# Patient Record
Sex: Female | Born: 1975 | Race: White | Hispanic: No | Marital: Married | Smoking: Never smoker
Health system: Southern US, Community
[De-identification: ages and names within clinical notes are randomized; demographics above are authoritative.]

## PROBLEM LIST (undated history)

## (undated) DIAGNOSIS — J45990 Exercise induced bronchospasm: Secondary | ICD-10-CM

## (undated) DIAGNOSIS — Z87442 Personal history of urinary calculi: Secondary | ICD-10-CM

## (undated) DIAGNOSIS — K219 Gastro-esophageal reflux disease without esophagitis: Secondary | ICD-10-CM

## (undated) DIAGNOSIS — D509 Iron deficiency anemia, unspecified: Secondary | ICD-10-CM

## (undated) DIAGNOSIS — K603 Anal fistula: Secondary | ICD-10-CM

---
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
## 2015-06-30 ENCOUNTER — Other Ambulatory Visit: Payer: Self-pay | Admitting: Physician Assistant

## 2015-06-30 DIAGNOSIS — R198 Other specified symptoms and signs involving the digestive system and abdomen: Secondary | ICD-10-CM

## 2015-06-30 DIAGNOSIS — L988 Other specified disorders of the skin and subcutaneous tissue: Secondary | ICD-10-CM

## 2015-07-06 ENCOUNTER — Ambulatory Visit
Admission: RE | Admit: 2015-07-06 | Discharge: 2015-07-06 | Disposition: A | Discharge status: Home / Self Care | Payer: BLUE CROSS/BLUE SHIELD | Source: Ambulatory Visit | Attending: Physician Assistant | Admitting: Physician Assistant

## 2015-07-06 DIAGNOSIS — R198 Other specified symptoms and signs involving the digestive system and abdomen: Secondary | ICD-10-CM

## 2015-07-06 DIAGNOSIS — L988 Other specified disorders of the skin and subcutaneous tissue: Secondary | ICD-10-CM

## 2015-07-06 MED ORDER — IOPAMIDOL (ISOVUE-300) INJECTION 61%
100.0000 mL | Freq: Once | INTRAVENOUS | Status: AC | PRN
  Administered 2015-07-06: 100 mL via INTRAVENOUS

## 2015-08-09 ENCOUNTER — Other Ambulatory Visit: Payer: Self-pay | Admitting: General Surgery

## 2015-08-09 NOTE — H&P (Signed)
Debbie EdwardsCynthia E. Duffy 08/09/2015 9:02 AM Location: [facility] Patient #: [id number] DOB: 23-Jan-1976 Married / Language: [place] / Race: White Female  History of Present Illness Debbie Duffy([name] MD; 08/09/2015 9:20 AM) Patient words: fissure.  The patient is a 39 year old female who presents with a complaint of anal problems. 39 year old female who has a history of an anal fissure approximately one year prior. She presents today with approximately four-month history of bleeding with bowel movements as well as purulent rectal drainage. She denies any pain. She is having regular bowel movements.   Other Problems [name](Kelly Dockery, LPN; 04/54/098111/28/2016 9:02 AM) Asthma Back Pain Gastroesophageal Reflux Disease Hemorrhoids Kidney Stone  Past Surgical History [name](Kelly Dockery, LPN; 19/14/782911/28/2016 9:02 AM) Oral Surgery  Diagnostic Studies History [name](Kelly Dockery, LPN; 56/21/308611/28/2016 9:02 AM) Colonoscopy never Mammogram within last year Pap Smear 1-5 years ago  Allergies [name](Kelly Dockery, LPN; 57/84/696211/28/2016 9:03 AM) No Known Drug Allergies 08/09/2015  Medication History [name](Kelly Dockery, LPN; 95/28/413211/28/2016 9:03 AM) Colleen CanJunel 1/20 (1-20MG -MCG Tablet, Oral) Active. Multivitamins (Oral) Active. Medications Reconciled  Social History [name](Kelly Dockery, LPN; 44/01/027211/28/2016 9:02 AM) Alcohol use Occasional alcohol use. Caffeine use Carbonated beverages. No drug use Tobacco use Never smoker.  Family History [name](Kelly Dockery, LPN; 53/66/440311/28/2016 9:02 AM) Breast Cancer Family Members In General. Heart disease in female family member before age 40  Pregnancy / Birth History [name](Kelly Dockery, LPN; 47/42/595611/28/2016 9:02 AM) Age at menarche 11 years. Contraceptive History Oral contraceptives. Gravida 3 Maternal age 40-25 Para 3 Regular periods     Review of Systems [name](Kelly Dockery LPN; 38/75/643311/28/2016 9:02 AM) General Present- Fatigue. Not Present- Appetite Loss, Chills, Fever, Night Sweats, Weight Gain and  Weight Loss. Skin Not Present- Change in Wart/Mole, Dryness, Hives, Jaundice, New Lesions, Non-Healing Wounds, Rash and Ulcer. HEENT Not Present- Earache, Hearing Loss, Hoarseness, Nose Bleed, Oral Ulcers, Ringing in the Ears, Seasonal Allergies, Sinus Pain, Sore Throat, Visual Disturbances, Wears glasses/contact lenses and Yellow Eyes. Respiratory Not Present- Bloody sputum, Chronic Cough, Difficulty Breathing, Snoring and Wheezing. Breast Not Present- Breast Mass, Breast Pain, Nipple Discharge and Skin Changes. Cardiovascular Not Present- Chest Pain, Difficulty Breathing Lying Down, Leg Cramps, Palpitations, Rapid Heart Rate, Shortness of Breath and Swelling of Extremities. Gastrointestinal Present- Abdominal Pain, Bloating, Bloody Stool, Change in Bowel Habits, Excessive gas, Hemorrhoids and Indigestion. Not Present- Chronic diarrhea, Constipation, Difficulty Swallowing, Gets full quickly at meals, Nausea, Rectal Pain and Vomiting. Female Genitourinary Not Present- Frequency, Nocturia, Painful Urination, Pelvic Pain and Urgency. Musculoskeletal Not Present- Back Pain, Joint Pain, Joint Stiffness, Muscle Pain, Muscle Weakness and Swelling of Extremities. Neurological Not Present- Decreased Memory, Fainting, Headaches, Numbness, Seizures, Tingling, Tremor, Trouble walking and Weakness. Psychiatric Not Present- Anxiety, Bipolar, Change in Sleep Pattern, Depression, Fearful and Frequent crying. Endocrine Present- Hair Changes. Not Present- Cold Intolerance, Excessive Hunger, Heat Intolerance, Hot flashes and New Diabetes. Hematology Not Present- Easy Bruising, Excessive bleeding, Gland problems, HIV and Persistent Infections.  Vitals Tresa Endo(Kelly Dockery LPN; 29/51/884111/28/2016 9:03 AM) 08/09/2015 9:02 AM Weight: 162.2 lb Height: 63in Body Surface Area: 1.77 m Body Mass Index: 28.73 kg/m  Temp.: 98.67F(Oral)  Pulse: 65 (Regular)  BP: 120/74 (Sitting, Left Arm, Standard)      Physical Exam  Debbie Duffy(Tishia Maestre MD; 08/09/2015 9:26 AM)  General Mental Status-Alert. General Appearance-Not in acute distress. Build & Nutrition-Well nourished. Posture-Normal posture. Gait-Normal.  Head and Neck Head-normocephalic, atraumatic with no lesions or palpable masses. Trachea-midline.  Chest and Lung Exam Chest and lung exam reveals -on auscultation, normal breath sounds, no  adventitious sounds and normal vocal resonance.  Cardiovascular Cardiovascular examination reveals -normal heart sounds, regular rate and rhythm with no murmurs.  Abdomen Inspection Inspection of the abdomen reveals - No Hernias. Palpation/Percussion Palpation and Percussion of the abdomen reveal - Soft, Non Tender, No Rigidity (guarding), No hepatosplenomegaly and No Palpable abdominal masses.  Rectal Anorectal Exam External - anorectal fistula(posterior midline).  Neurologic Neurologic evaluation reveals -alert and oriented x 3 with no impairment of recent or remote memory, normal attention span and ability to concentrate, normal sensation and normal coordination.  Musculoskeletal Normal Exam - Bilateral-Upper Extremity Strength Normal and Lower Extremity Strength Normal.    Assessment & Plan Debbie Levee MD; 08/09/2015 9:28 AM)  ANAL FISTULA (K60.3) Impression: 39 year old female who presents to the office with complaints of purulent drainage and bleeding with bowel movements. On exam today she has a posterior midline wound with purulent drainage. This appears to be a fistula. I have recommended annual exam under anesthesia and after confirmation, fistulotomy. We discussed risk and benefits this. Risk include continued pain and bleeding. There is approximately 2% risk of recurrence. I think this is a high likelihood of relieving her symptoms. We discussed that the wound will probably be to slow to heal and may take up to 4-6 weeks.

## 2015-09-08 ENCOUNTER — Encounter (HOSPITAL_BASED_OUTPATIENT_CLINIC_OR_DEPARTMENT_OTHER): Payer: Self-pay | Admitting: *Deleted

## 2015-09-08 NOTE — Progress Notes (Signed)
NPO AFTER MN.  ARRIVE AT 0730.  NEEDS HG AND URINE PREG.  WILL TAKE AM MEDS W/ SIPS OF WATER.

## 2015-09-15 ENCOUNTER — Ambulatory Visit (HOSPITAL_BASED_OUTPATIENT_CLINIC_OR_DEPARTMENT_OTHER): Payer: Managed Care, Other (non HMO) | Admitting: Anesthesiology

## 2015-09-15 ENCOUNTER — Encounter (HOSPITAL_BASED_OUTPATIENT_CLINIC_OR_DEPARTMENT_OTHER): Payer: Self-pay | Admitting: *Deleted

## 2015-09-15 ENCOUNTER — Encounter (HOSPITAL_BASED_OUTPATIENT_CLINIC_OR_DEPARTMENT_OTHER)
Admission: RE | Disposition: A | Discharge status: Home / Self Care | Payer: Self-pay | Source: Ambulatory Visit | Attending: General Surgery

## 2015-09-15 ENCOUNTER — Ambulatory Visit (HOSPITAL_BASED_OUTPATIENT_CLINIC_OR_DEPARTMENT_OTHER)
Admission: RE | Admit: 2015-09-15 | Discharge: 2015-09-15 | Disposition: A | Discharge status: Home / Self Care | Payer: Managed Care, Other (non HMO) | Source: Ambulatory Visit | Attending: General Surgery | Admitting: General Surgery

## 2015-09-15 DIAGNOSIS — K219 Gastro-esophageal reflux disease without esophagitis: Secondary | ICD-10-CM | POA: Diagnosis not present

## 2015-09-15 DIAGNOSIS — J45909 Unspecified asthma, uncomplicated: Secondary | ICD-10-CM | POA: Insufficient documentation

## 2015-09-15 DIAGNOSIS — Z87442 Personal history of urinary calculi: Secondary | ICD-10-CM | POA: Diagnosis not present

## 2015-09-15 DIAGNOSIS — K644 Residual hemorrhoidal skin tags: Secondary | ICD-10-CM | POA: Diagnosis not present

## 2015-09-15 DIAGNOSIS — K603 Anal fistula: Secondary | ICD-10-CM | POA: Diagnosis not present

## 2015-09-15 HISTORY — DX: Anal fistula: K60.3

## 2015-09-15 HISTORY — DX: Iron deficiency anemia, unspecified: D50.9

## 2015-09-15 HISTORY — DX: Personal history of urinary calculi: Z87.442

## 2015-09-15 HISTORY — DX: Gastro-esophageal reflux disease without esophagitis: K21.9

## 2015-09-15 HISTORY — DX: Exercise induced bronchospasm: J45.990

## 2015-09-15 HISTORY — PX: EVALUATION UNDER ANESTHESIA WITH FISTULECTOMY: SHX5623

## 2015-09-15 LAB — POCT PREGNANCY, URINE: PREG TEST UR: NEGATIVE

## 2015-09-15 LAB — POCT HEMOGLOBIN-HEMACUE: HEMOGLOBIN: 14.8 g/dL (ref 12.0–15.0)

## 2015-09-15 SURGERY — EXAM UNDER ANESTHESIA WITH FISTULECTOMY
Anesthesia: Monitor Anesthesia Care

## 2015-09-15 MED ORDER — HYDROMORPHONE HCL 1 MG/ML IJ SOLN
0.2500 mg | INTRAMUSCULAR | Status: DC | PRN
  Filled 2015-09-15: qty 1

## 2015-09-15 MED ORDER — LIDOCAINE 5 % EX OINT
TOPICAL_OINTMENT | CUTANEOUS | Status: DC | PRN
  Administered 2015-09-15: 1

## 2015-09-15 MED ORDER — ACETAMINOPHEN 325 MG PO TABS
650.0000 mg | ORAL_TABLET | ORAL | Status: DC | PRN
  Filled 2015-09-15: qty 2

## 2015-09-15 MED ORDER — BUPIVACAINE-EPINEPHRINE 0.5% -1:200000 IJ SOLN
INTRAMUSCULAR | Status: DC | PRN
  Administered 2015-09-15: 17 mL

## 2015-09-15 MED ORDER — FENTANYL CITRATE (PF) 100 MCG/2ML IJ SOLN
INTRAMUSCULAR | Status: DC | PRN
  Administered 2015-09-15: 25 ug via INTRAVENOUS

## 2015-09-15 MED ORDER — ONDANSETRON HCL 4 MG/2ML IJ SOLN
INTRAMUSCULAR | Status: AC
  Filled 2015-09-15: qty 2

## 2015-09-15 MED ORDER — BUPIVACAINE LIPOSOME 1.3 % IJ SUSP
INTRAMUSCULAR | Status: DC | PRN
  Administered 2015-09-15: 20 mL

## 2015-09-15 MED ORDER — PROPOFOL 500 MG/50ML IV EMUL
INTRAVENOUS | Status: AC
  Filled 2015-09-15: qty 50

## 2015-09-15 MED ORDER — SODIUM CHLORIDE 0.9 % IJ SOLN
INTRAMUSCULAR | Status: AC
  Filled 2015-09-15: qty 50

## 2015-09-15 MED ORDER — OXYCODONE HCL 5 MG PO TABS
5.0000 mg | ORAL_TABLET | Freq: Once | ORAL | Status: DC | PRN
  Filled 2015-09-15: qty 1

## 2015-09-15 MED ORDER — SODIUM CHLORIDE 0.9 % IV SOLN
250.0000 mL | INTRAVENOUS | Status: DC | PRN
  Filled 2015-09-15: qty 250

## 2015-09-15 MED ORDER — SODIUM CHLORIDE 0.9 % IJ SOLN
3.0000 mL | Freq: Two times a day (BID) | INTRAMUSCULAR | Status: DC
  Filled 2015-09-15: qty 3

## 2015-09-15 MED ORDER — SODIUM CHLORIDE 0.9 % IJ SOLN
3.0000 mL | INTRAMUSCULAR | Status: DC | PRN
  Filled 2015-09-15: qty 3

## 2015-09-15 MED ORDER — LIDOCAINE HCL (CARDIAC) 20 MG/ML IV SOLN
INTRAVENOUS | Status: AC
  Filled 2015-09-15: qty 5

## 2015-09-15 MED ORDER — BUPIVACAINE-EPINEPHRINE (PF) 0.5% -1:200000 IJ SOLN
INTRAMUSCULAR | Status: AC
  Filled 2015-09-15: qty 30

## 2015-09-15 MED ORDER — ARTIFICIAL TEARS OP OINT
TOPICAL_OINTMENT | OPHTHALMIC | Status: AC
  Filled 2015-09-15: qty 3.5

## 2015-09-15 MED ORDER — KETAMINE HCL 10 MG/ML IJ SOLN
INTRAMUSCULAR | Status: DC | PRN
  Administered 2015-09-15 (×2): 15 mg via INTRAVENOUS

## 2015-09-15 MED ORDER — KETAMINE HCL 10 MG/ML IJ SOLN
INTRAMUSCULAR | Status: AC
  Filled 2015-09-15: qty 1

## 2015-09-15 MED ORDER — FENTANYL CITRATE (PF) 100 MCG/2ML IJ SOLN
INTRAMUSCULAR | Status: AC
  Filled 2015-09-15: qty 2

## 2015-09-15 MED ORDER — ONDANSETRON HCL 4 MG/2ML IJ SOLN
INTRAMUSCULAR | Status: DC | PRN
  Administered 2015-09-15: 4 mg via INTRAVENOUS

## 2015-09-15 MED ORDER — OXYCODONE HCL 5 MG PO TABS: 5.0000 mg | ORAL_TABLET | ORAL | Status: AC | PRN

## 2015-09-15 MED ORDER — DEXAMETHASONE SODIUM PHOSPHATE 10 MG/ML IJ SOLN
INTRAMUSCULAR | Status: AC
  Filled 2015-09-15: qty 1

## 2015-09-15 MED ORDER — PROPOFOL 500 MG/50ML IV EMUL
INTRAVENOUS | Status: DC | PRN
  Administered 2015-09-15: 50 ug/kg/min via INTRAVENOUS

## 2015-09-15 MED ORDER — OXYCODONE HCL 5 MG PO TABS
5.0000 mg | ORAL_TABLET | ORAL | Status: DC | PRN
  Filled 2015-09-15: qty 2

## 2015-09-15 MED ORDER — MIDAZOLAM HCL 5 MG/5ML IJ SOLN
INTRAMUSCULAR | Status: DC | PRN
  Administered 2015-09-15 (×2): 2 mg via INTRAVENOUS

## 2015-09-15 MED ORDER — BUPIVACAINE LIPOSOME 1.3 % IJ SUSP
INTRAMUSCULAR | Status: AC
  Filled 2015-09-15: qty 20

## 2015-09-15 MED ORDER — LIDOCAINE HCL (CARDIAC) 20 MG/ML IV SOLN
INTRAVENOUS | Status: DC | PRN
  Administered 2015-09-15: 50 mg via INTRAVENOUS

## 2015-09-15 MED ORDER — DEXAMETHASONE SODIUM PHOSPHATE 4 MG/ML IJ SOLN
INTRAMUSCULAR | Status: DC | PRN
  Administered 2015-09-15: 10 mg via INTRAVENOUS

## 2015-09-15 MED ORDER — ACETAMINOPHEN 650 MG RE SUPP
650.0000 mg | RECTAL | Status: DC | PRN
  Filled 2015-09-15: qty 1

## 2015-09-15 MED ORDER — ONDANSETRON HCL 4 MG/2ML IJ SOLN
4.0000 mg | Freq: Once | INTRAMUSCULAR | Status: AC
  Administered 2015-09-15: 4 mg via INTRAVENOUS
  Filled 2015-09-15: qty 2

## 2015-09-15 MED ORDER — LIDOCAINE 5 % EX OINT
TOPICAL_OINTMENT | CUTANEOUS | Status: AC
  Filled 2015-09-15: qty 35.44

## 2015-09-15 MED ORDER — OXYCODONE HCL 5 MG/5ML PO SOLN
5.0000 mg | Freq: Once | ORAL | Status: DC | PRN
  Filled 2015-09-15: qty 5

## 2015-09-15 MED ORDER — MIDAZOLAM HCL 2 MG/2ML IJ SOLN
INTRAMUSCULAR | Status: AC
  Filled 2015-09-15: qty 4

## 2015-09-15 MED ORDER — MEPERIDINE HCL 25 MG/ML IJ SOLN
6.2500 mg | INTRAMUSCULAR | Status: DC | PRN
  Filled 2015-09-15: qty 1

## 2015-09-15 MED ORDER — LACTATED RINGERS IV SOLN
INTRAVENOUS | Status: DC
  Administered 2015-09-15 (×2): via INTRAVENOUS
  Filled 2015-09-15: qty 1000

## 2015-09-15 MED ORDER — 0.9 % SODIUM CHLORIDE (POUR BTL) OPTIME
TOPICAL | Status: DC | PRN
  Administered 2015-09-15: 500 mL

## 2015-09-15 SURGICAL SUPPLY — 45 items
BENZOIN TINCTURE PRP APPL 2/3 (GAUZE/BANDAGES/DRESSINGS) ×2 IMPLANT
BLADE HEX COATED 2.75 (ELECTRODE) IMPLANT
BLADE SURG 10 STRL SS (BLADE) IMPLANT
BRIEF STRETCH FOR OB PAD LRG (UNDERPADS AND DIAPERS) ×4 IMPLANT
COVER BACK TABLE 60X90IN (DRAPES) ×2 IMPLANT
COVER MAYO STAND STRL (DRAPES) ×2 IMPLANT
DRAPE PED LAPAROTOMY (DRAPES) ×2 IMPLANT
DRAPE UTILITY XL STRL (DRAPES) ×2 IMPLANT
DRSG PAD ABDOMINAL 8X10 ST (GAUZE/BANDAGES/DRESSINGS) ×2 IMPLANT
ELECT BLADE 6.5 .24CM SHAFT (ELECTRODE) IMPLANT
ELECT REM PT RETURN 9FT ADLT (ELECTROSURGICAL) ×2
ELECTRODE REM PT RTRN 9FT ADLT (ELECTROSURGICAL) ×1 IMPLANT
GAUZE SPONGE 4X4 12PLY STRL (GAUZE/BANDAGES/DRESSINGS) ×2 IMPLANT
GAUZE SPONGE 4X4 16PLY XRAY LF (GAUZE/BANDAGES/DRESSINGS) IMPLANT
GLOVE BIO SURGEON STRL SZ 6.5 (GLOVE) IMPLANT
GLOVE BIOGEL PI IND STRL 6.5 (GLOVE) ×1 IMPLANT
GLOVE BIOGEL PI INDICATOR 6.5 (GLOVE) ×1
GLOVE INDICATOR 7.0 STRL GRN (GLOVE) ×2 IMPLANT
GOWN STRL REUS W/ TWL LRG LVL3 (GOWN DISPOSABLE) ×1 IMPLANT
GOWN STRL REUS W/TWL 2XL LVL3 (GOWN DISPOSABLE) ×4 IMPLANT
GOWN STRL REUS W/TWL LRG LVL3 (GOWN DISPOSABLE) ×1
HYDROGEN PEROXIDE 16OZ (MISCELLANEOUS) IMPLANT
KIT ROOM TURNOVER WOR (KITS) ×2 IMPLANT
NEEDLE HYPO 22GX1.5 SAFETY (NEEDLE) ×2 IMPLANT
NEEDLE HYPO 25X1 1.5 SAFETY (NEEDLE) IMPLANT
NS IRRIG 500ML POUR BTL (IV SOLUTION) ×2 IMPLANT
PACK BASIN DAY SURGERY FS (CUSTOM PROCEDURE TRAY) ×2 IMPLANT
PAD ABD 8X10 STRL (GAUZE/BANDAGES/DRESSINGS) ×2 IMPLANT
PAD ARMBOARD 7.5X6 YLW CONV (MISCELLANEOUS) ×2 IMPLANT
PENCIL BUTTON HOLSTER BLD 10FT (ELECTRODE) ×2 IMPLANT
SPONGE GAUZE 4X4 12PLY (GAUZE/BANDAGES/DRESSINGS) ×2 IMPLANT
SPONGE SURGIFOAM ABS GEL 100 (HEMOSTASIS) IMPLANT
SPONGE SURGIFOAM ABS GEL 12-7 (HEMOSTASIS) IMPLANT
STAPLER PROXIMATE HCS (STAPLE) IMPLANT
STAPLER VISISTAT 35W (STAPLE) ×2 IMPLANT
SUT CHROMIC 2 0 SH (SUTURE) IMPLANT
SUT GUT CHROMIC 3 0 (SUTURE) ×2 IMPLANT
SUT PROLENE 2 0 BLUE (SUTURE) IMPLANT
SUT VIC AB 4-0 P-3 18XBRD (SUTURE) IMPLANT
SUT VIC AB 4-0 P3 18 (SUTURE)
SUT VIC AB 4-0 SH 18 (SUTURE) IMPLANT
SYR CONTROL 10ML LL (SYRINGE) ×2 IMPLANT
TRAY DSU PREP LF (CUSTOM PROCEDURE TRAY) ×2 IMPLANT
TUBE CONNECTING 12X1/4 (SUCTIONS) ×2 IMPLANT
YANKAUER SUCT BULB TIP NO VENT (SUCTIONS) ×2 IMPLANT

## 2015-09-15 NOTE — H&P (Signed)
The patient is a 40 year old female who presents with a complaint of anal problems. 40 year old female who has a history of an anal fissure approximately one year prior. She presents today with approximately four-month history of bleeding with bowel movements as well as purulent rectal drainage. She denies any pain. She is having regular bowel movements.   Other Problems [name], LPN; 81/19/1478 9:02 AM) Asthma Back Pain Gastroesophageal Reflux Disease Hemorrhoids Kidney Stone  Past Surgical History [name], LPN; 29/56/2130 9:02 AM) Oral Surgery  Diagnostic Studies History [name], LPN; 86/57/8469 9:02 AM) Colonoscopy never Mammogram within last year Pap Smear 1-5 years ago  Allergies [name], LPN; 62/95/2841 9:03 AM) No Known Drug Allergies 08/09/2015  Medication History [name], LPN; 32/44/0102 9:03 AM) Colleen Can 1/20 (1-20MG -MCG Tablet, Oral) Active. Multivitamins (Oral) Active. Medications Reconciled  Social History [name], LPN; 72/53/6644 9:02 AM) Alcohol use Occasional alcohol use. Caffeine use Carbonated beverages. No drug use Tobacco use Never smoker.  Family History [name], LPN; 03/47/4259 9:02 AM) Breast Cancer Family Members In General. Heart disease in female family member before age 5  Pregnancy / Birth History [name], LPN; 56/38/7564 9:02 AM) Age at menarche 11 years. Contraceptive History Oral contraceptives. Gravida 3 Maternal age 34-25 Para 3 Regular periods     Review of Systems  General Present- Fatigue. Not Present- Appetite Loss, Chills, Fever, Night Sweats, Weight Gain and Weight Loss. Skin Not Present- Change in Wart/Mole, Dryness, Hives, Jaundice, New Lesions, Non-Healing Wounds, Rash and Ulcer. HEENT Not Present- Earache, Hearing Loss, Hoarseness, Nose Bleed, Oral Ulcers, Ringing in the Ears, Seasonal Allergies, Sinus Pain, Sore Throat, Visual Disturbances,  Wears glasses/contact lenses and Yellow Eyes. Respiratory Not Present- Bloody sputum, Chronic Cough, Difficulty Breathing, Snoring and Wheezing. Breast Not Present- Breast Mass, Breast Pain, Nipple Discharge and Skin Changes. Cardiovascular Not Present- Chest Pain, Difficulty Breathing Lying Down, Leg Cramps, Palpitations, Rapid Heart Rate, Shortness of Breath and Swelling of Extremities. Gastrointestinal Present- Abdominal Pain, Bloating, Bloody Stool, Change in Bowel Habits, Excessive gas, Hemorrhoids and Indigestion. Not Present- Chronic diarrhea, Constipation, Difficulty Swallowing, Gets full quickly at meals, Nausea, Rectal Pain and Vomiting. Female Genitourinary Not Present- Frequency, Nocturia, Painful Urination, Pelvic Pain and Urgency. Musculoskeletal Not Present- Back Pain, Joint Pain, Joint Stiffness, Muscle Pain, Muscle Weakness and Swelling of Extremities. Neurological Not Present- Decreased Memory, Fainting, Headaches, Numbness, Seizures, Tingling, Tremor, Trouble walking and Weakness. Psychiatric Not Present- Anxiety, Bipolar, Change in Sleep Pattern, Depression, Fearful and Frequent crying. Endocrine Present- Hair Changes. Not Present- Cold Intolerance, Excessive Hunger, Heat Intolerance, Hot flashes and New Diabetes. Hematology Not Present- Easy Bruising, Excessive bleeding, Gland problems, HIV and Persistent Infections.  BP 123/73 mmHg  Pulse 67  Temp(Src) 98.6 F (37 C) (Oral)  Resp 16  Ht 5\' 3"  (1.6 m)  Wt 70.761 kg (156 lb)  BMI 27.64 kg/m2  SpO2 99%  LMP 09/11/2015   Physical Exam  General Mental Status-Alert. General Appearance-Not in acute distress. Build & Nutrition-Well nourished. Posture-Normal posture. Gait-Normal.  Head and Neck Head-normocephalic, atraumatic with no lesions or palpable masses. Trachea-midline.  Chest and Lung Exam Chest and lung exam reveals -on auscultation, normal breath sounds, no adventitious sounds and normal  vocal resonance.  Cardiovascular Cardiovascular examination reveals -normal heart sounds, regular rate and rhythm with no murmurs.  Abdomen Inspection Inspection of the abdomen reveals - No Hernias. Palpation/Percussion Palpation and Percussion of the abdomen reveal - Soft, Non Tender, No Rigidity (guarding), No hepatosplenomegaly and No Palpable abdominal  masses.  Rectal Anorectal Exam External - anorectal fistula(posterior midline).  Neurologic Neurologic evaluation reveals -alert and oriented x 3 with no impairment of recent or remote memory, normal attention span and ability to concentrate, normal sensation and normal coordination.  Musculoskeletal Normal Exam - Bilateral-Upper Extremity Strength Normal and Lower Extremity Strength Normal.    Assessment & Plan   ANAL FISTULA (K60.3) Impression: 40 year old female who presents to the office with complaints of purulent drainage and bleeding with bowel movements. On exam today she has a posterior midline wound with purulent drainage. This appears to be a fistula. I have recommended annual exam under anesthesia and after confirmation, fistulotomy. We discussed risk and benefits this. Risk include continued pain and bleeding. There is approximately 2% risk of recurrence. I think this is a high likelihood of relieving her symptoms. We discussed that the wound will probably be to slow to heal and may take up to 4-6 weeks.  She would also like to me to evaluate her skin.  We will excise this as well if it does not look like it will add much to her pain and recovery time.

## 2015-09-15 NOTE — Anesthesia Postprocedure Evaluation (Signed)
Anesthesia Post Note  Patient: Debbie BellingCynthia Duffy  Procedure(s) Performed: Procedure(s) (LRB): ANAL EXAM UNDER ANESTHESIA; FISTULOTMY; ANAL SKIN TAG EXCISION (N/A)  Patient location during evaluation: PACU Anesthesia Type: General Level of consciousness: awake and alert Pain management: pain level controlled Vital Signs Assessment: post-procedure vital signs reviewed and stable Respiratory status: spontaneous breathing, nonlabored ventilation and respiratory function stable Cardiovascular status: blood pressure returned to baseline and stable Postop Assessment: no signs of nausea or vomiting Anesthetic complications: no    Last Vitals:  Filed Vitals:   09/15/15 0927 09/15/15 0930  BP: 118/56 118/67  Pulse: 87 83  Temp: 36.6 C   Resp: 21 16    Last Pain: There were no vitals filed for this visit.               Naveah Brave A

## 2015-09-15 NOTE — Anesthesia Preprocedure Evaluation (Signed)
Anesthesia Evaluation  Patient identified by MRN, date of birth, ID band Patient awake    Reviewed: Allergy & Precautions, NPO status , Patient's Chart, lab work & pertinent test results  Airway Mallampati: I  TM Distance: >3 FB Neck ROM: Full    Dental  (+) Teeth Intact, Dental Advisory Given   Pulmonary asthma ,    breath sounds clear to auscultation       Cardiovascular  Rhythm:Regular Rate:Normal     Neuro/Psych    GI/Hepatic GERD  Medicated and Controlled,  Endo/Other    Renal/GU      Musculoskeletal   Abdominal   Peds  Hematology   Anesthesia Other Findings   Reproductive/Obstetrics                             Anesthesia Physical Anesthesia Plan  ASA: II  Anesthesia Plan: General   Post-op Pain Management:    Induction: Intravenous  Airway Management Planned: LMA  Additional Equipment:   Intra-op Plan:   Post-operative Plan: Extubation in OR  Informed Consent: I have reviewed the patients History and Physical, chart, labs and discussed the procedure including the risks, benefits and alternatives for the proposed anesthesia with the patient or authorized representative who has indicated his/her understanding and acceptance.   Dental advisory given  Plan Discussed with: CRNA, Anesthesiologist and Surgeon  Anesthesia Plan Comments:         Anesthesia Quick Evaluation

## 2015-09-15 NOTE — Discharge Instructions (Addendum)

## 2015-09-15 NOTE — Transfer of Care (Signed)
Last Vitals:  Filed Vitals:   09/15/15 0735  BP: 123/73  Pulse: 67  Temp: 37 C  Resp: 16    Immediate Anesthesia Transfer of Care Note  Patient: Debbie Duffy  Procedure(s) Performed: Procedure(s) (LRB): ANAL EXAM UNDER ANESTHESIA; FISTULOTMY; ANAL SKIN TAG EXCISION (N/A)  Patient Location: PACU  Anesthesia Type: MAC Level of Consciousness: awake, alert  and oriented  Airway & Oxygen Therapy: Patient Spontanous Breathing and Patient connected to nasal cannula oxygen  Post-op Assessment: Report given to PACU RN and Post -op Vital signs reviewed and stable  Post vital signs: Reviewed and stable  Complications: No apparent anesthesia complications

## 2015-09-15 NOTE — Anesthesia Procedure Notes (Signed)
Procedure Name: MAC Performed by: Norva PavlovALLAWAY, Estephanie Hubbs G Pre-anesthesia Checklist: Patient identified, Timeout performed, Emergency Drugs available, Suction available and Patient being monitored Patient Re-evaluated:Patient Re-evaluated prior to inductionOxygen Delivery Method: Nasal cannula Preoxygenation: Pre-oxygenation with 100% oxygen Placement Confirmation: positive ETCO2,  CO2 detector and breath sounds checked- equal and bilateral

## 2015-09-15 NOTE — Op Note (Signed)
09/15/2015  9:20 AM  PATIENT:  Debbie Duffy  40 y.o. female  No care team member to display  PRE-OPERATIVE DIAGNOSIS:  anal fistula, anal skin tag  POST-OPERATIVE DIAGNOSIS:  anal fistula, anal skin tag  PROCEDURE:   ANAL EXAM UNDER ANESTHESIA; FISTULOTMY; ANAL SKIN TAG EXCISION  SURGEON:  Surgeon(s): Romie LeveeAlicia Yamina Lenis, MD  ASSISTANT: none   ANESTHESIA:   local and MAC  SPECIMEN:  Source of Specimen:  anterior skin tag  DISPOSITION OF SPECIMEN:  PATHOLOGY  COUNTS:  YES  PLAN OF CARE: Discharge to home after PACU  PATIENT DISPOSITION:  PACU - hemodynamically stable.  INDICATION: 40 y.o. F with anal fissure and fistula.  She also has a bothersome anterior anal skin tag   OR FINDINGS: anterior anal skin tag.  Superficial posterior anal fistula.  DESCRIPTION: the patient was identified in the preoperative holding area and taken to the OR where they were laid on the operating room table.  MAC anesthesia was induced without difficulty. The patient was then positioned in prone jackknife position with buttocks gently taped apart.  The patient was then prepped and draped in usual sterile fashion.  SCDs were noted to be in place prior to the initiation of anesthesia. A surgical timeout was performed indicating the correct patient, procedure, positioning and need for preoperative antibiotics.  A rectal block was performed using Marcaine with epinephrine.    I began with a digital rectal exam.  There were no masses.  I then placed a Hill-Ferguson anoscope into the anal canal and evaluated this completely. The patient had minimal hemorrhoid disease and no other pathologic findings. A small fistula was noted externally within the skin and subcutaneous tissues at posterior midline. A fistulotomy was performed with electrocautery. The edges were tacked down using 3-0 chromic suture. This was a relatively small fistula, so we decided to perform the excision of skin tag as well. This was removed  with Metzenbaum scissors.  The edges were close together using a 3-0 running chromic suture. Exparel was placed around the anal canal for an additional rectal block. The patient was then awakened from anesthesia and sent to the postanesthesia care unit in stable condition.  All counts were correct per operating room staff.

## 2015-09-16 ENCOUNTER — Encounter (HOSPITAL_BASED_OUTPATIENT_CLINIC_OR_DEPARTMENT_OTHER): Payer: Self-pay | Admitting: General Surgery

## 2015-11-30 ENCOUNTER — Other Ambulatory Visit: Payer: Self-pay | Admitting: Gastroenterology

## 2016-03-17 IMAGING — CT CT PELVIS W/ CM
2 of 3 series · 17 of 46 positions shown, 19 images · IV contrast (APPLIED)
Comparison: None.

CLINICAL DATA: Right lower quadrant abdominal pain.

EXAM:
CT PELVIS WITH CONTRAST
TECHNIQUE: Multidetector CT imaging of the pelvis was performed using the
standard protocol following the bolus administration of intravenous
contrast.
CONTRAST:  100mL XRUB5N-Y99 IOPAMIDOL (XRUB5N-Y99) INJECTION 61%

[Series 2: routine pelvis w/cm · axial · 0.89mm/px · z∈[-374,-144]mm · 14 of 54 slices shown, 16 images]
[im 4/54  soft-tissue]
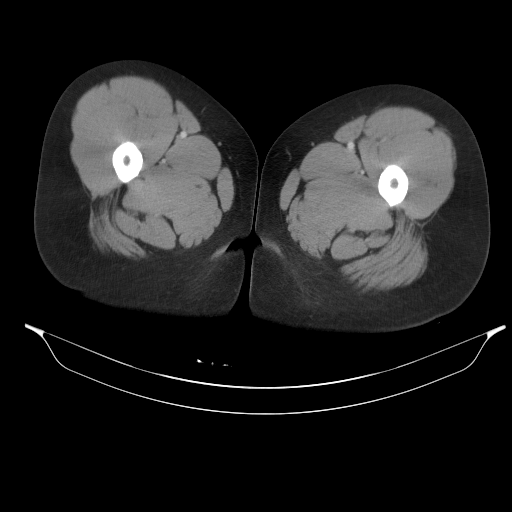
[im 4/54  bone]
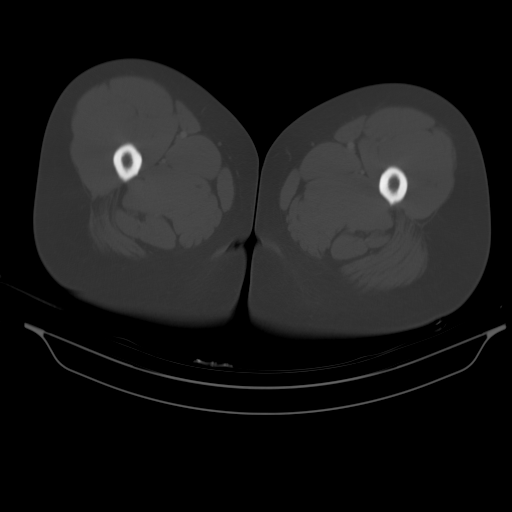
[im 7/54  soft-tissue]
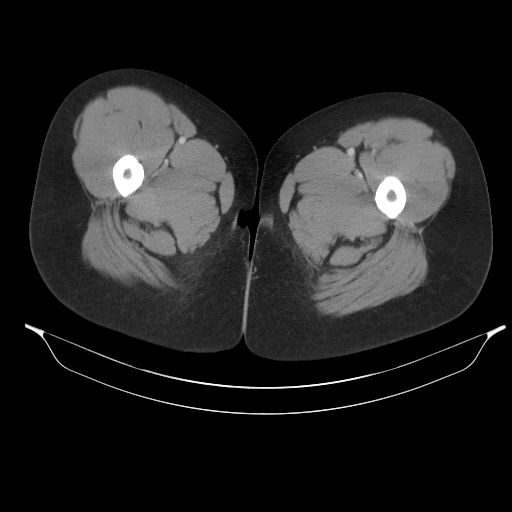
[im 11/54  soft-tissue]
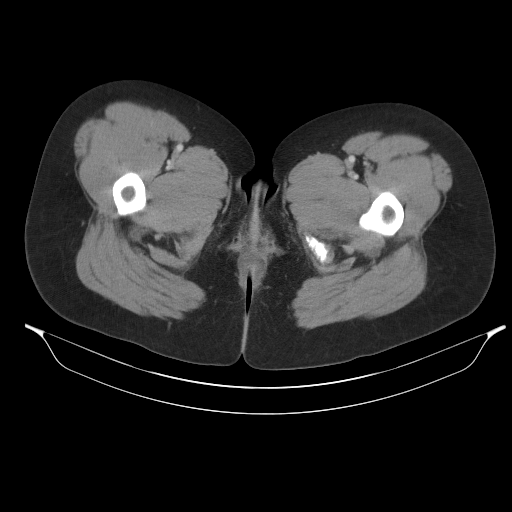
[im 14/54  soft-tissue]
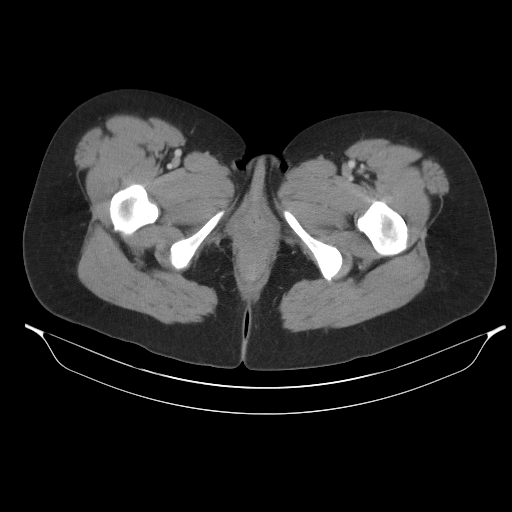
[im 18/54  soft-tissue]
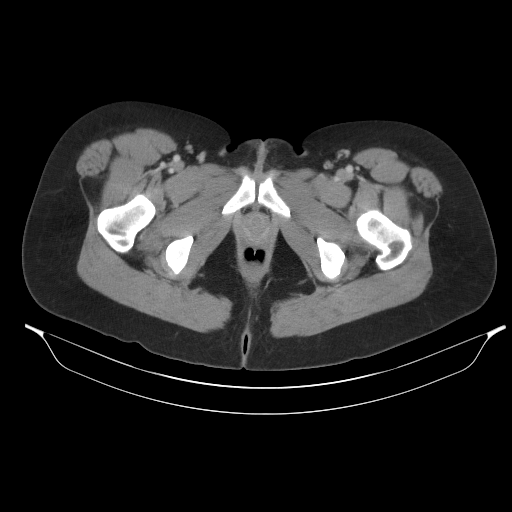
[im 21/54  soft-tissue]
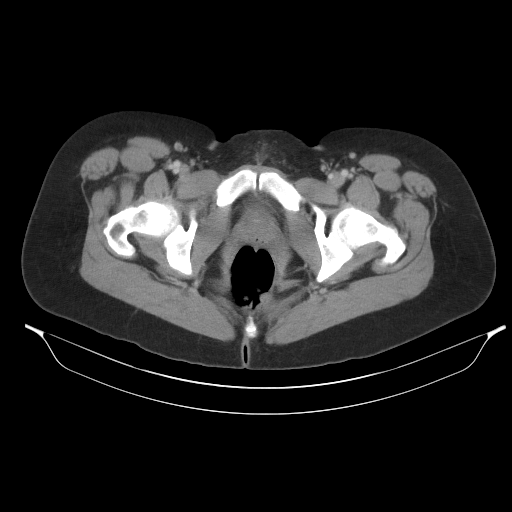
[im 24/54  soft-tissue]
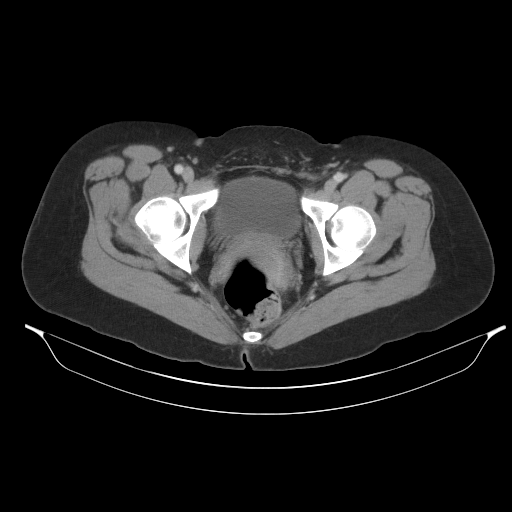
[im 30/54  soft-tissue]
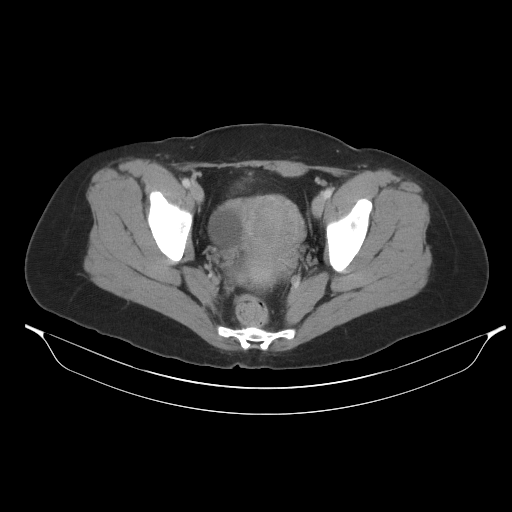
[im 33/54  soft-tissue]
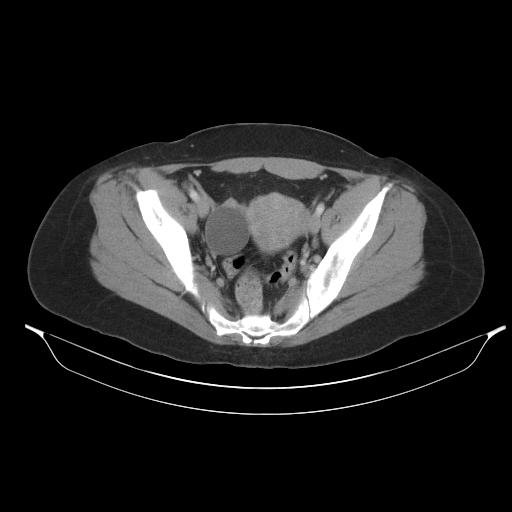
[im 33/54  bone]
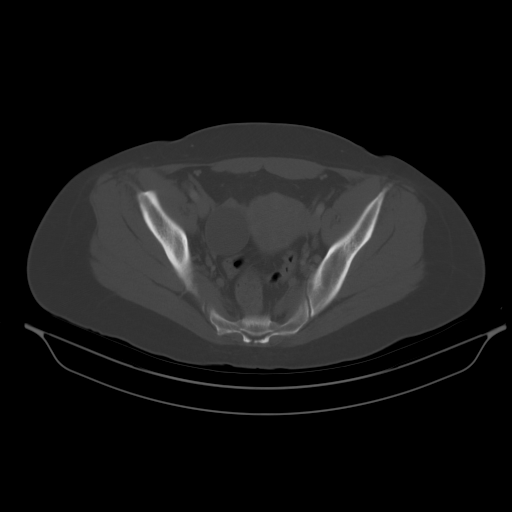
[im 36/54  soft-tissue]
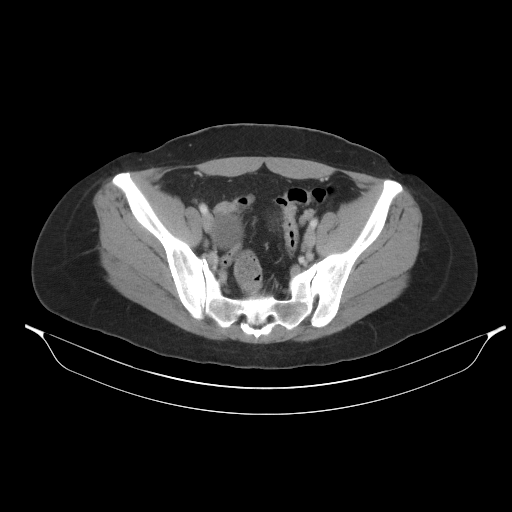
[im 40/54  soft-tissue]
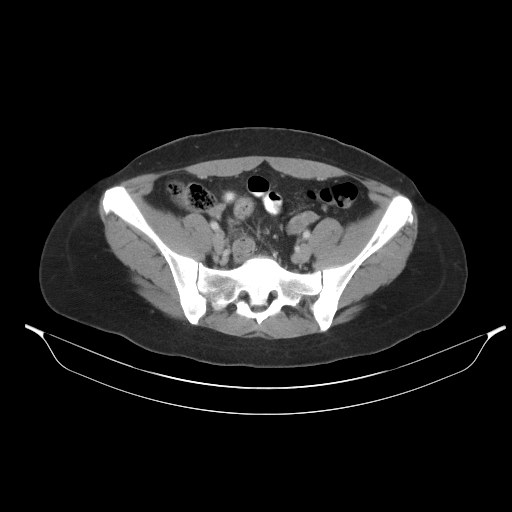
[im 43/54  soft-tissue]
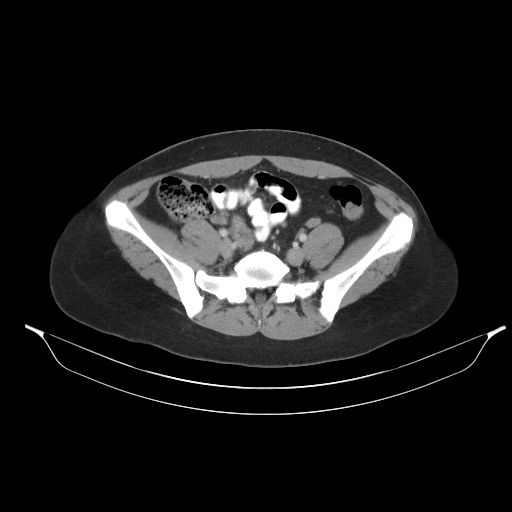
[im 47/54  soft-tissue]
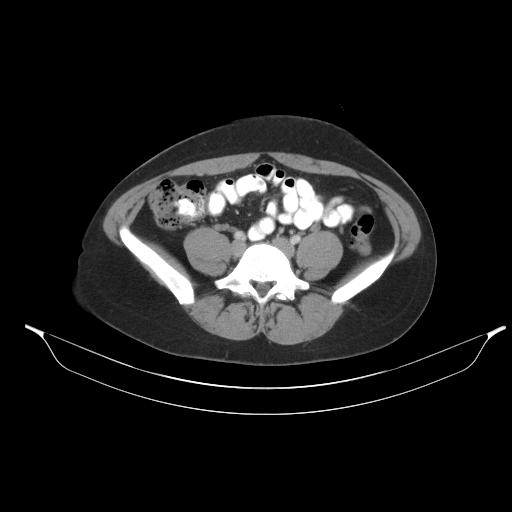
[im 50/54  soft-tissue]
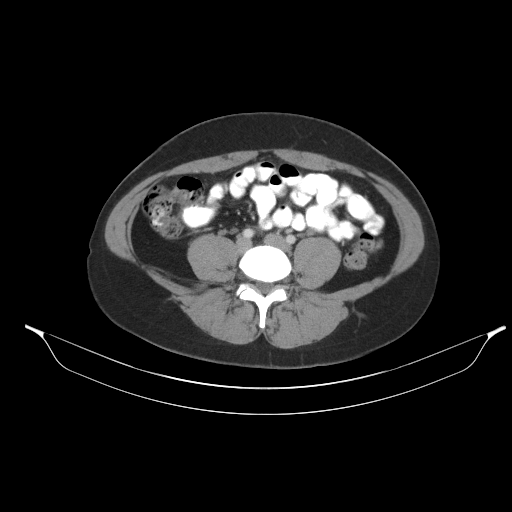

[Series 4: cor · coronal · 0.63mm/px · 3 of 77 slices shown]
[im 26/77  soft-tissue]
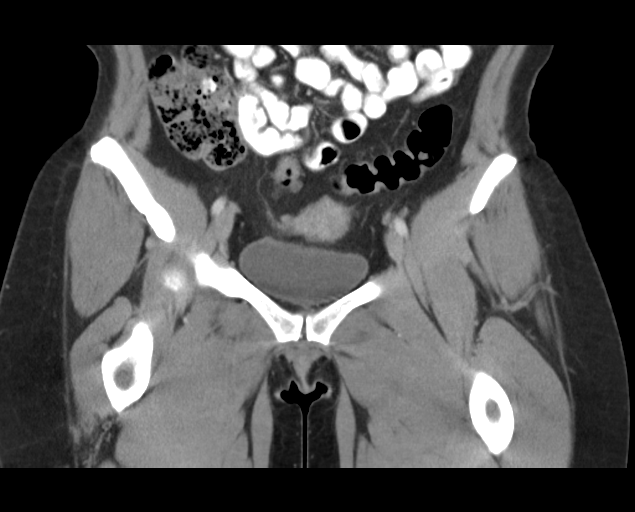
[im 34/77  soft-tissue]
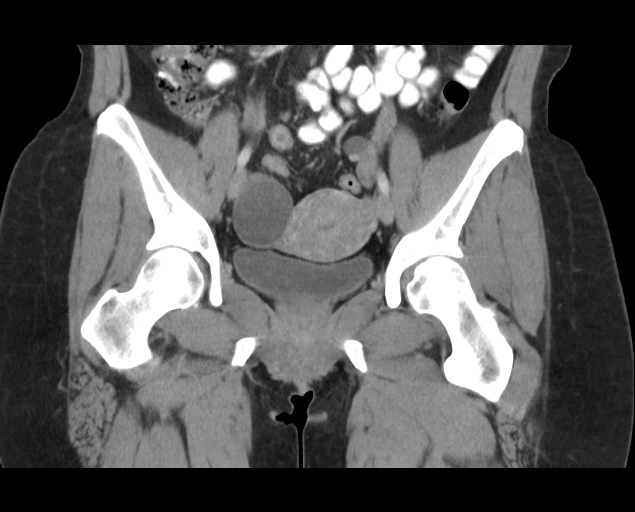
[im 43/77  soft-tissue]
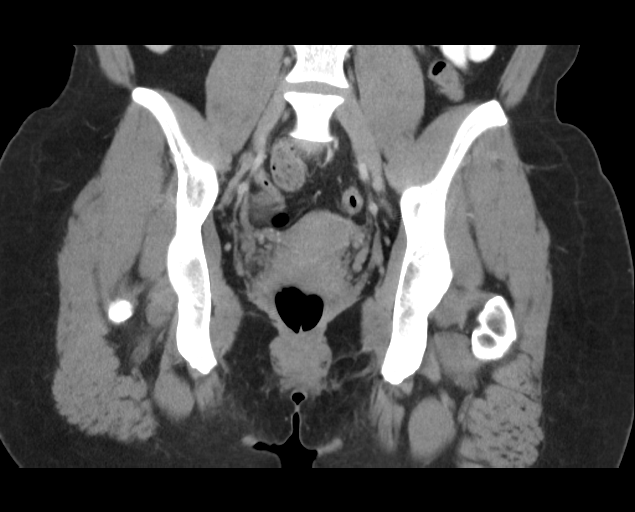

[17 of 46 positions shown; findings below may reference images not displayed]

FINDINGS: No significant osseous abnormality is noted. The appendix appears
normal. There is no evidence of bowel obstruction. No abnormal fluid
collection or abscess is noted. No significant adenopathy is noted.
Urinary bladder appears normal. Uterus and left ovary appear normal.
4.5 cm right ovarian simple cyst is noted.
IMPRESSION: 4.5 cm simple right ovarian cyst is noted. No other abnormality seen
in the pelvis.

## 2017-12-12 ENCOUNTER — Ambulatory Visit: Payer: Self-pay | Admitting: Nurse Practitioner

## 2017-12-12 VITALS — BP 122/78 | HR 63 | Temp 98.8°F | Resp 18

## 2017-12-12 DIAGNOSIS — H9202 Otalgia, left ear: Secondary | ICD-10-CM

## 2017-12-12 MED ORDER — PREDNISONE 10 MG (21) PO TBPK: ORAL_TABLET | ORAL | 0 refills | Status: AC

## 2017-12-12 NOTE — Progress Notes (Signed)
   Subjective:    Patient ID: Debbie BellingCynthia Duffy, female    DOB: 23-Apr-1976, 42 y.o.   MRN: [id number]  Patient presents for complaints of left ear pain with symptoms starting greater than one week ago.  The patient denies any other complaints with the pain, no fever, chills, hearing loss or change in hearing.  Patient [place] and that is when the pain became worse.  Patient has not taken any medications for her symptoms.   Otalgia   There is pain in the left ear. This is a new problem. The current episode started 1 to 4 weeks ago. The problem occurs constantly. The problem has been unchanged. There has been no fever. The pain is at a severity of 6/10. The pain is moderate. Pertinent negatives include no abdominal pain, coughing, headaches, neck pain, rhinorrhea or sore throat. She has tried nothing for the symptoms. There is no history of a chronic ear infection or hearing loss.      Review of Systems  Constitutional: Negative.   HENT: Positive for ear pain. Negative for postnasal drip, rhinorrhea, sinus pain, sneezing and sore throat.   Eyes: Negative.   Respiratory: Negative.  Negative for cough.   Gastrointestinal: Negative for abdominal pain.  Musculoskeletal: Negative for neck pain.  Skin: Negative.   Allergic/Immunologic: Negative.   Neurological: Negative for headaches.       Objective:   Physical Exam  Constitutional: She is oriented to person, place, and time. She appears well-developed and well-nourished.  HENT:  Head: Normocephalic and atraumatic.  Right Ear: External ear normal.  Left Ear: External ear normal.  Mouth/Throat: Oropharynx is clear and moist.  Eyes: Pupils are equal, round, and reactive to light. Conjunctivae and EOM are normal.  Neck: Normal range of motion. Neck supple. No thyromegaly present.  Cardiovascular: Normal rate, regular rhythm and normal heart sounds.  Pulmonary/Chest: Effort normal and breath sounds normal. No  respiratory distress.  Abdominal: Soft. Bowel sounds are normal.  Neurological: She is alert and oriented to person, place, and time.  Skin: Skin is warm and dry.          Assessment & Plan:  Left Otalgia Patient prescribed Sterapred.  Patient will use Tylenol for pain.  Patient instructed to avoid putting anything in the ear canal or letting water get into the ear.  Patient will follow up in ER if hearing loss or change in hearing.  Patient will follow up if symptoms do not improve.  Patient verbalizes understanding and has no questions at time of discharge. Meds ordered this encounter  Medications  . predniSONE (STERAPRED UNI-PAK 21 TAB) 10 MG (21) TBPK tablet    Sig: Take as directed.    Dispense:  21 tablet    Refill:  0    Order Specific Question:   Supervising Provider    Answer:   Debbie Duffy, Debbie E [phone]]

## 2017-12-12 NOTE — Patient Instructions (Signed)

## 2017-12-21 ENCOUNTER — Telehealth: Payer: Self-pay

## 2017-12-21 NOTE — Telephone Encounter (Signed)
I tried following up with pt to see how she is feeling and pt vm has not been set up.

## 2022-09-03 ENCOUNTER — Emergency Department (HOSPITAL_BASED_OUTPATIENT_CLINIC_OR_DEPARTMENT_OTHER): Payer: No Typology Code available for payment source

## 2022-09-03 ENCOUNTER — Emergency Department (HOSPITAL_BASED_OUTPATIENT_CLINIC_OR_DEPARTMENT_OTHER)
Admission: EM | Admit: 2022-09-03 | Discharge: 2022-09-03 | Disposition: A | Discharge status: Home / Self Care | Payer: No Typology Code available for payment source | Attending: Emergency Medicine | Admitting: Emergency Medicine

## 2022-09-03 ENCOUNTER — Encounter (HOSPITAL_BASED_OUTPATIENT_CLINIC_OR_DEPARTMENT_OTHER): Payer: Self-pay

## 2022-09-03 DIAGNOSIS — S0990XA Unspecified injury of head, initial encounter: Secondary | ICD-10-CM | POA: Insufficient documentation

## 2022-09-03 DIAGNOSIS — W0110XA Fall on same level from slipping, tripping and stumbling with subsequent striking against unspecified object, initial encounter: Secondary | ICD-10-CM | POA: Diagnosis not present

## 2022-09-03 DIAGNOSIS — Y92512 Supermarket, store or market as the place of occurrence of the external cause: Secondary | ICD-10-CM | POA: Diagnosis not present

## 2022-09-03 DIAGNOSIS — Z91040 Latex allergy status: Secondary | ICD-10-CM | POA: Insufficient documentation

## 2022-09-03 DIAGNOSIS — R519 Headache, unspecified: Secondary | ICD-10-CM | POA: Diagnosis present

## 2022-09-03 NOTE — ED Provider Notes (Signed)
[facility] EMERGENCY DEPT Provider Note   CSN: [id number] Arrival date & time: 09/03/22  1532     History  Chief Complaint  Patient presents with   Headache    Debbie Duffy is a 46 y.o. female presenting to ED with a headache and dizziness after reporting that a light fixture fell and struck her head while at a shopping store earlier today.  She says she denies loss of consciousness.  She felt dazed afterwards.  She was mildly nauseous.  She is some pain in her upper neck.  No balance problems.  She is here with her husband.  She is not on blood thinners  HPI     Home Medications Prior to Admission medications   Medication Sig Start Date End Date Taking? Authorizing Provider  ALBUTEROL IN Inhale into the lungs as needed.    [provider]  Multiple Vitamins-Iron (MULTIVITAMIN/IRON PO) Take 1 tablet by mouth every morning.    [provider]  norethindrone-ethinyl estradiol (JUNEL FE,GILDESS FE,LOESTRIN FE) 1-20 MG-MCG tablet Take 1 tablet by mouth every morning.    [provider]  norgestimate-ethinyl estradiol (ORTHO-CYCLEN,SPRINTEC,PREVIFEM) 0.25-35 MG-MCG tablet Take 1 tablet by mouth daily.    [provider]  omeprazole (PRILOSEC) 20 MG capsule Take 20 mg by mouth every morning.    [provider]  oxyCODONE (OXY IR/ROXICODONE) 5 MG immediate release tablet Take 1-2 tablets (5-10 mg total) by mouth every 4 (four) hours as needed. Patient not taking: Reported on 12/12/2017 09/15/15   [name], MD  traZODone (DESYREL) 50 MG tablet Take 50 mg by mouth at bedtime.    [provider]      Allergies    Latex    Review of Systems   Review of Systems  Physical Exam Updated Vital Signs BP 126/68 (BP Location: Right Arm)   Pulse 76   Temp 98 F (36.7 C) (Oral)   Resp 16   SpO2 100%  Physical Exam Constitutional:      General: She is not in acute distress. HENT:     Head: Normocephalic and  atraumatic.  Eyes:     General: No visual field deficit.    Conjunctiva/sclera: Conjunctivae normal.     Pupils: Pupils are equal, round, and reactive to light.  Cardiovascular:     Rate and Rhythm: Normal rate and regular rhythm.  Pulmonary:     Effort: Pulmonary effort is normal. No respiratory distress.  Musculoskeletal:     Cervical back: Normal range of motion and neck supple.  Skin:    General: Skin is warm and dry.  Neurological:     General: No focal deficit present.     Mental Status: She is alert and oriented to person, place, and time. Mental status is at baseline.     GCS: GCS eye subscore is 4. GCS verbal subscore is 5. GCS motor subscore is 6.     Cranial Nerves: No cranial nerve deficit or dysarthria.     Coordination: Coordination normal.     Gait: Gait normal.  Psychiatric:        Mood and Affect: Mood normal.        Behavior: Behavior normal.     ED Results / Procedures / Treatments   Labs (all labs ordered are listed, but only abnormal results are displayed) Labs Reviewed - No data to display  EKG None  Radiology CT Cervical Spine Wo Contrast  Result Date: 09/03/2022 CLINICAL DATA:  Polytrauma, blunt EXAM:  CT HEAD WITHOUT CONTRAST CT CERVICAL SPINE WITHOUT CONTRAST TECHNIQUE: Multidetector CT imaging of the head and cervical spine was performed following the standard protocol without intravenous contrast. Multiplanar CT image reconstructions of the cervical spine were also generated. RADIATION DOSE REDUCTION: This exam was performed according to the departmental dose-optimization program which includes automated exposure control, adjustment of the mA and/or kV according to patient size and/or use of iterative reconstruction technique. COMPARISON:  None Available. FINDINGS: CT HEAD FINDINGS Brain: No evidence of acute infarction, hemorrhage, hydrocephalus, extra-axial collection or mass lesion/mass effect. Vascular: No hyperdense vessel or unexpected  calcification. Skull: Normal. Negative for fracture or focal lesion. Sinuses/Orbits: No acute finding. Other: Negative for scalp hematoma. CT CERVICAL SPINE FINDINGS Alignment: Facet joints are aligned without dislocation or traumatic listhesis. Dens and lateral masses are aligned. Skull base and vertebrae: No acute fracture. No primary bone lesion or focal pathologic process. Soft tissues and spinal canal: No prevertebral fluid or swelling. No visible canal hematoma. Disc levels:  Within normal limits. Upper chest: Negative. Other: None. IMPRESSION: 1. No acute intracranial process. 2. No acute fracture or subluxation of the cervical spine. Electronically Signed   By: Duanne Guess D.O.   On: 09/03/2022 16:10   CT Head Wo Contrast  Result Date: 09/03/2022 CLINICAL DATA:  Polytrauma, blunt EXAM: CT HEAD WITHOUT CONTRAST CT CERVICAL SPINE WITHOUT CONTRAST TECHNIQUE: Multidetector CT imaging of the head and cervical spine was performed following the standard protocol without intravenous contrast. Multiplanar CT image reconstructions of the cervical spine were also generated. RADIATION DOSE REDUCTION: This exam was performed according to the departmental dose-optimization program which includes automated exposure control, adjustment of the mA and/or kV according to patient size and/or use of iterative reconstruction technique. COMPARISON:  None Available. FINDINGS: CT HEAD FINDINGS Brain: No evidence of acute infarction, hemorrhage, hydrocephalus, extra-axial collection or mass lesion/mass effect. Vascular: No hyperdense vessel or unexpected calcification. Skull: Normal. Negative for fracture or focal lesion. Sinuses/Orbits: No acute finding. Other: Negative for scalp hematoma. CT CERVICAL SPINE FINDINGS Alignment: Facet joints are aligned without dislocation or traumatic listhesis. Dens and lateral masses are aligned. Skull base and vertebrae: No acute fracture. No primary bone lesion or focal pathologic  process. Soft tissues and spinal canal: No prevertebral fluid or swelling. No visible canal hematoma. Disc levels:  Within normal limits. Upper chest: Negative. Other: None. IMPRESSION: 1. No acute intracranial process. 2. No acute fracture or subluxation of the cervical spine. Electronically Signed   By: Duanne Guess D.O.   On: 09/03/2022 16:10    Procedures Procedures    Medications Ordered in ED Medications - No data to display  ED Course/ Medical Decision Making/ A&P                           Medical Decision Making Amount and/or Complexity of Data Reviewed Radiology: ordered.   Patient is here for an isolated head injury today.  CT scans of the head and neck ordered and proceed with interpreted, with no acute abnormalities.  Patient likely demonstrate signs of a mild to moderate concussion, although the onset of her injury earlier today, it may be too early for a formal diagnosis.  We discussed conservative care at home, I would expect her to make a full recovery within 2 to 4 weeks, but if she has not recovered she can follow-up with [facility].  She and her husband verbalized understanding.  Her husband  will take her home.        Final Clinical Impression(s) / ED Diagnoses Final diagnoses:  Injury of head, initial encounter    Rx / DC Orders ED Discharge Orders     None         Khye Hochstetler, Kermit Balo, MD 09/03/22 1816

## 2022-09-03 NOTE — ED Triage Notes (Signed)
Pt was at [employer], light panel fell & hit her head. Evaluated by EMS, advised all results WNL. Pt now w nausea, fatigue, HA. No neuro deficits in triage, GCS 15, A&O4

## 2022-09-03 NOTE — ED Notes (Signed)
Discharge paperwork given and verbally understood.
